# Patient Record
Sex: Male | Born: 1960 | Race: White | Hispanic: No | Smoking: Current every day smoker
Health system: Southern US, Community
[De-identification: ages and names within clinical notes are randomized; demographics above are authoritative.]

## PROBLEM LIST (undated history)

## (undated) DIAGNOSIS — K4090 Unilateral inguinal hernia, without obstruction or gangrene, not specified as recurrent: Secondary | ICD-10-CM

## (undated) DIAGNOSIS — Z972 Presence of dental prosthetic device (complete) (partial): Secondary | ICD-10-CM

## (undated) DIAGNOSIS — N401 Enlarged prostate with lower urinary tract symptoms: Secondary | ICD-10-CM

## (undated) DIAGNOSIS — R351 Nocturia: Secondary | ICD-10-CM

## (undated) DIAGNOSIS — K219 Gastro-esophageal reflux disease without esophagitis: Secondary | ICD-10-CM

## (undated) DIAGNOSIS — Z87828 Personal history of other (healed) physical injury and trauma: Secondary | ICD-10-CM

---

## 1991-03-10 HISTORY — PX: TRANSURETHRAL RESECTION OF PROSTATE: SHX73

## 2004-01-30 ENCOUNTER — Ambulatory Visit: Payer: Self-pay | Admitting: Family Medicine

## 2004-02-26 ENCOUNTER — Ambulatory Visit: Payer: Self-pay | Admitting: Internal Medicine

## 2007-09-19 ENCOUNTER — Encounter: Payer: Self-pay | Admitting: Internal Medicine

## 2016-09-30 ENCOUNTER — Ambulatory Visit: Payer: Self-pay | Admitting: General Surgery

## 2016-09-30 NOTE — H&P (Signed)
Mario KingfisherRichard Glover 09/30/2016 11:33 AM Location: [facility] Patient #: [id number] DOB: June 30, 1960 Single / Language: [place] / Race: White Male  History of Present Illness Mario Glover(Dierdra Salameh J. Cloria Ciresi MD; 09/30/2016 12:21 PM) The patient is a 56 year old male.   Note:He is referred by Dr. [name] for consultation regarding a symptomatic left inguinal hernia. He has a lot of testicular pain on the left side especially when sitting or when having sex. He is been seen by other physicians. Dr. [name] saw him and diagnosed him with a left inguinal hernia. He states his brother has had inguinal hernias in the past. He does have to intermittently strain to start his urinary stream. No constipation. No chronic cough. He is a smoker.  Past [place] History ([name]; 09/30/2016 11:33 AM) TURP  Allergies ([name]; 09/30/2016 11:33 AM) No Known Drug Allergies 09/30/2016  Medication History ([name]; 09/30/2016 11:34 AM) No Current Medications Medications Reconciled  Social History ([name], CMA; 09/30/2016 11:33 AM) Alcohol use Occasional alcohol use. Caffeine use Carbonated beverages. No drug use Tobacco use Current some day smoker.  Family History ([name]; 09/30/2016 11:33 AM) Alcohol Abuse Brother. Heart Disease Father. Heart disease in male family member before age 57  Other Problems ([name], CMA; 09/30/2016 11:33 AM) Inguinal Hernia     Review of Systems ([name] CMA; 09/30/2016 11:33 AM) General Not Present- Appetite Loss, Chills, Fatigue, Fever, Night Sweats, Weight Gain and Weight Loss. Skin Not Present- Change in Wart/Mole, Dryness, Hives, Jaundice, New Lesions, Non-Healing Wounds, Rash and Ulcer. HEENT Present- Wears glasses/contact lenses. Not Present- Earache, Hearing Loss, Hoarseness, Nose Bleed, Oral Ulcers, Ringing in the Ears, Seasonal Allergies, Sinus Pain, Sore Throat, Visual Disturbances and  Yellow Eyes. Respiratory Present- Snoring. Not Present- Bloody sputum, Chronic Cough, Difficulty Breathing and Wheezing. Breast Not Present- Breast Mass, Breast Pain, Nipple Discharge and Skin Changes. Cardiovascular Not Present- Chest Pain, Difficulty Breathing Lying Down, Leg Cramps, Palpitations, Rapid Heart Rate, Shortness of Breath and Swelling of Extremities. Gastrointestinal Not Present- Abdominal Pain, Bloating, Bloody Stool, Change in Bowel Habits, Chronic diarrhea, Constipation, Difficulty Swallowing, Excessive gas, Gets full quickly at meals, Hemorrhoids, Indigestion, Nausea, Rectal Pain and Vomiting. Male Genitourinary Not Present- Blood in Urine, Change in Urinary Stream, Frequency, Impotence, Nocturia, Painful Urination, Urgency and Urine Leakage.  Vitals ([name] CMA; 09/30/2016 11:34 AM) 09/30/2016 11:34 AM Weight: 235.5 lb Height: 71in Body Surface Area: 2.26 m Body Mass Index: 32.85 kg/m  Temp.: 98.58F(Oral)  Pulse: 84 (Regular)  BP: 144/92 (Sitting, Left Arm, Standard)      Physical Exam Mario Glover(Makela Niehoff J. Bobbyjo Marulanda MD; 09/30/2016 12:31 PM)  The physical exam findings are as follows: Note:GENERAL APPEARANCE: Obese male in NAD. Pleasant and cooperative.  EARS, NOSE, MOUTH THROAT: [place]/AT external ears: no lesions or deformities external nose: no lesions or deformities hearing: grossly normal lips: moist, no deformities EYES external: conjunctiva, lids, sclerae normal pupils: equal, round glasses: no   CV ascultation: RRR, no murmur extremity edema: no  RESP/CHEST auscultation: breath sounds equal and clear respiratory effort: normal  GASTROINTESTINAL abdomen: Soft, obese, non-tender, non-distended, no masses liver and spleen: not enlarged. hernia: small umbilical hernia; left inguinal hernia scar: none present   GENITOURINARY scrotum: no masses, mild left testicular tenderness penis: no lesions  MUSCULOSKELETAL station  and gait: normal digits/nails: contractures on hands muscle strength: grossly normal all extremities instability: none  SKIN jaundice: none  NEUROLOGIC speech: normal  PSYCHIATRIC alertness and orientation: normal mood/affect/behavior: normal judgement  and insight: normal    Assessment & Plan Mario Glover(Brendolyn Stockley J. Eyan Hagood MD; 09/30/2016 12:32 PM)  INGUINAL HERNIA OF LEFT SIDE WITHOUT OBSTRUCTION OR GANGRENE (K40.90) Impression: This is symptomatic and he would like something done about it. Also has a small umbilical hernia which is asymptomatic. He does have some symptoms suggesting BPH.  Plan: Preoperative Flomax. Open left inguinal hernia repair with mesh. I have explained the procedure, risks, and aftercare of inguinal hernia repair. Risks include but are not limited to bleeding, infection, wound problems, anesthesia, recurrence, bladder or intestine injury, urinary retention, testicular dysfunction,failure to relieve his current pain, chronic postop pain, mesh problems. He seems to understand and agrees with the plan. I have asked him to stop smoking.  Avel Peaceodd Danyla Wattley, M.D.

## 2016-10-01 ENCOUNTER — Encounter (HOSPITAL_BASED_OUTPATIENT_CLINIC_OR_DEPARTMENT_OTHER): Payer: Self-pay | Admitting: *Deleted

## 2016-10-01 NOTE — Progress Notes (Signed)
NPO AFTER MN.   ARRIVE AT [id number].  NEEDS HG.  WILL TAKE PEPCID AM DOS W/ SIPS OF WATER.  PT VERBALIZED UNDERSTANDING TO DO HIBICLENS SHOWER HS BEFORE AND AM DOS.

## 2016-10-12 ENCOUNTER — Ambulatory Visit (HOSPITAL_BASED_OUTPATIENT_CLINIC_OR_DEPARTMENT_OTHER)
Admission: RE | Admit: 2016-10-12 | Discharge: 2016-10-12 | Disposition: A | Discharge status: Home / Self Care | Payer: Self-pay | Source: Ambulatory Visit | Attending: General Surgery | Admitting: General Surgery

## 2016-10-12 ENCOUNTER — Encounter (HOSPITAL_BASED_OUTPATIENT_CLINIC_OR_DEPARTMENT_OTHER): Payer: Self-pay | Admitting: Anesthesiology

## 2016-10-12 ENCOUNTER — Ambulatory Visit (HOSPITAL_BASED_OUTPATIENT_CLINIC_OR_DEPARTMENT_OTHER): Payer: Self-pay | Admitting: Anesthesiology

## 2016-10-12 ENCOUNTER — Encounter (HOSPITAL_BASED_OUTPATIENT_CLINIC_OR_DEPARTMENT_OTHER)
Admission: RE | Disposition: A | Discharge status: Home / Self Care | Payer: Self-pay | Source: Ambulatory Visit | Attending: General Surgery

## 2016-10-12 DIAGNOSIS — K429 Umbilical hernia without obstruction or gangrene: Secondary | ICD-10-CM | POA: Insufficient documentation

## 2016-10-12 DIAGNOSIS — K4090 Unilateral inguinal hernia, without obstruction or gangrene, not specified as recurrent: Secondary | ICD-10-CM | POA: Insufficient documentation

## 2016-10-12 DIAGNOSIS — F17200 Nicotine dependence, unspecified, uncomplicated: Secondary | ICD-10-CM | POA: Insufficient documentation

## 2016-10-12 HISTORY — DX: Gastro-esophageal reflux disease without esophagitis: K21.9

## 2016-10-12 HISTORY — DX: Presence of dental prosthetic device (complete) (partial): Z97.2

## 2016-10-12 HISTORY — DX: Unilateral inguinal hernia, without obstruction or gangrene, not specified as recurrent: K40.90

## 2016-10-12 HISTORY — PX: INGUINAL HERNIA REPAIR: SHX194

## 2016-10-12 HISTORY — DX: Nocturia: R35.1

## 2016-10-12 HISTORY — DX: Benign prostatic hyperplasia with lower urinary tract symptoms: N40.1

## 2016-10-12 HISTORY — PX: INSERTION OF MESH: SHX5868

## 2016-10-12 HISTORY — DX: Personal history of other (healed) physical injury and trauma: Z87.828

## 2016-10-12 LAB — POCT HEMOGLOBIN-HEMACUE: HEMOGLOBIN: 17.6 g/dL — AB (ref 13.0–17.0)

## 2016-10-12 SURGERY — REPAIR, HERNIA, INGUINAL, ADULT
Anesthesia: General | Site: Abdomen | Laterality: Left

## 2016-10-12 MED ORDER — FENTANYL CITRATE (PF) 100 MCG/2ML IJ SOLN
INTRAMUSCULAR | Status: AC
  Filled 2016-10-12: qty 2

## 2016-10-12 MED ORDER — LIDOCAINE 2% (20 MG/ML) 5 ML SYRINGE
INTRAMUSCULAR | Status: DC | PRN
  Administered 2016-10-12: 100 mg via INTRAVENOUS

## 2016-10-12 MED ORDER — ACETAMINOPHEN 325 MG PO TABS
650.0000 mg | ORAL_TABLET | ORAL | Status: DC | PRN
  Filled 2016-10-12: qty 2

## 2016-10-12 MED ORDER — MIDAZOLAM HCL 5 MG/5ML IJ SOLN
INTRAMUSCULAR | Status: DC | PRN
  Administered 2016-10-12: 2 mg via INTRAVENOUS

## 2016-10-12 MED ORDER — LACTATED RINGERS IV SOLN
INTRAVENOUS | Status: DC
  Administered 2016-10-12 (×2): via INTRAVENOUS
  Filled 2016-10-12: qty 1000

## 2016-10-12 MED ORDER — CEFAZOLIN SODIUM-DEXTROSE 2-4 GM/100ML-% IV SOLN
INTRAVENOUS | Status: AC
Start: 2016-10-12 — End: 2016-10-12
  Filled 2016-10-12: qty 100

## 2016-10-12 MED ORDER — MIDAZOLAM HCL 2 MG/2ML IJ SOLN
1.0000 mg | Freq: Once | INTRAMUSCULAR | Status: AC
  Administered 2016-10-12: 1 mg via INTRAVENOUS
  Filled 2016-10-12: qty 1

## 2016-10-12 MED ORDER — ONDANSETRON HCL 4 MG/2ML IJ SOLN
INTRAMUSCULAR | Status: AC
  Filled 2016-10-12: qty 2

## 2016-10-12 MED ORDER — DEXAMETHASONE SODIUM PHOSPHATE 10 MG/ML IJ SOLN
INTRAMUSCULAR | Status: AC
  Filled 2016-10-12: qty 1

## 2016-10-12 MED ORDER — PROPOFOL 10 MG/ML IV BOLUS
INTRAVENOUS | Status: AC
  Filled 2016-10-12: qty 20

## 2016-10-12 MED ORDER — OXYCODONE HCL 5 MG PO TABS
5.0000 mg | ORAL_TABLET | ORAL | Status: DC | PRN
  Administered 2016-10-12: 5 mg via ORAL
  Filled 2016-10-12: qty 2

## 2016-10-12 MED ORDER — DEXAMETHASONE SODIUM PHOSPHATE 4 MG/ML IJ SOLN
INTRAMUSCULAR | Status: DC | PRN
  Administered 2016-10-12: 10 mg via INTRAVENOUS

## 2016-10-12 MED ORDER — CHLORHEXIDINE GLUCONATE CLOTH 2 % EX PADS
6.0000 | MEDICATED_PAD | Freq: Once | CUTANEOUS | Status: DC
  Filled 2016-10-12: qty 6

## 2016-10-12 MED ORDER — FENTANYL CITRATE (PF) 100 MCG/2ML IJ SOLN
INTRAMUSCULAR | Status: DC | PRN
  Administered 2016-10-12 (×8): 25 ug via INTRAVENOUS

## 2016-10-12 MED ORDER — ACETAMINOPHEN 650 MG RE SUPP
650.0000 mg | RECTAL | Status: DC | PRN
  Filled 2016-10-12: qty 1

## 2016-10-12 MED ORDER — MIDAZOLAM HCL 2 MG/2ML IJ SOLN
INTRAMUSCULAR | Status: AC
  Filled 2016-10-12: qty 2

## 2016-10-12 MED ORDER — KETOROLAC TROMETHAMINE 30 MG/ML IJ SOLN
INTRAMUSCULAR | Status: DC | PRN
  Administered 2016-10-12: 30 mg via INTRAVENOUS

## 2016-10-12 MED ORDER — CEFAZOLIN SODIUM-DEXTROSE 2-4 GM/100ML-% IV SOLN
2.0000 g | INTRAVENOUS | Status: AC
  Administered 2016-10-12: 2 g via INTRAVENOUS
  Filled 2016-10-12: qty 100

## 2016-10-12 MED ORDER — SODIUM CHLORIDE 0.9% FLUSH
3.0000 mL | INTRAVENOUS | Status: DC | PRN
  Filled 2016-10-12: qty 3

## 2016-10-12 MED ORDER — LIDOCAINE 2% (20 MG/ML) 5 ML SYRINGE
INTRAMUSCULAR | Status: AC
  Filled 2016-10-12: qty 5

## 2016-10-12 MED ORDER — HYDROMORPHONE HCL 1 MG/ML IJ SOLN
0.2500 mg | INTRAMUSCULAR | Status: DC | PRN
  Filled 2016-10-12: qty 0.5

## 2016-10-12 MED ORDER — ONDANSETRON HCL 4 MG/2ML IJ SOLN
INTRAMUSCULAR | Status: DC | PRN
  Administered 2016-10-12: 4 mg via INTRAVENOUS

## 2016-10-12 MED ORDER — BUPIVACAINE HCL (PF) 0.5 % IJ SOLN
INTRAMUSCULAR | Status: DC | PRN
  Administered 2016-10-12: 21 mL

## 2016-10-12 MED ORDER — FENTANYL CITRATE (PF) 100 MCG/2ML IJ SOLN
100.0000 ug | Freq: Once | INTRAMUSCULAR | Status: AC
Start: 2016-10-12 — End: 2016-10-12
  Administered 2016-10-12: 100 ug via INTRAVENOUS
  Filled 2016-10-12: qty 2

## 2016-10-12 MED ORDER — PROMETHAZINE HCL 25 MG/ML IJ SOLN
6.2500 mg | INTRAMUSCULAR | Status: DC | PRN
  Filled 2016-10-12: qty 1

## 2016-10-12 MED ORDER — PROPOFOL 10 MG/ML IV BOLUS
INTRAVENOUS | Status: DC | PRN
  Administered 2016-10-12 (×2): 100 mg via INTRAVENOUS
  Administered 2016-10-12: 200 mg via INTRAVENOUS

## 2016-10-12 MED ORDER — OXYCODONE HCL 5 MG PO TABS: 5.0000 mg | ORAL_TABLET | ORAL | 0 refills | Status: AC | PRN

## 2016-10-12 MED ORDER — KETOROLAC TROMETHAMINE 30 MG/ML IJ SOLN
INTRAMUSCULAR | Status: AC
  Filled 2016-10-12: qty 1

## 2016-10-12 MED ORDER — OXYCODONE HCL 5 MG PO TABS
ORAL_TABLET | ORAL | Status: AC
  Filled 2016-10-12: qty 1

## 2016-10-12 MED ORDER — BUPIVACAINE-EPINEPHRINE (PF) 0.5% -1:200000 IJ SOLN
INTRAMUSCULAR | Status: DC | PRN
  Administered 2016-10-12: 25 mL

## 2016-10-12 SURGICAL SUPPLY — 54 items
APL SKNCLS STERI-STRIP NONHPOA (GAUZE/BANDAGES/DRESSINGS) ×1
BENZOIN TINCTURE PRP APPL 2/3 (GAUZE/BANDAGES/DRESSINGS) ×4 IMPLANT
BLADE CLIPPER SURG (BLADE) ×4 IMPLANT
BLADE HEX COATED 2.75 (ELECTRODE) ×4 IMPLANT
BLADE SURG 10 STRL SS (BLADE) ×4 IMPLANT
BLADE SURG 15 STRL LF DISP TIS (BLADE) ×2 IMPLANT
BLADE SURG 15 STRL SS (BLADE) ×4
CANISTER SUCTION 1200CC (MISCELLANEOUS) ×4 IMPLANT
CHLORAPREP W/TINT 26ML (MISCELLANEOUS) ×4 IMPLANT
CLOSURE WOUND 1/2 X4 (GAUZE/BANDAGES/DRESSINGS) ×1
CLOTH BEACON ORANGE TIMEOUT ST (SAFETY) ×4 IMPLANT
COVER BACK TABLE 60X90IN (DRAPES) ×4 IMPLANT
COVER MAYO STAND STRL (DRAPES) ×4 IMPLANT
DISSECTOR ROUND CHERRY 3/8 STR (MISCELLANEOUS) IMPLANT
DRAIN PENROSE 18X1/2 LTX STRL (DRAIN) ×4 IMPLANT
DRAPE INCISE IOBAN 66X45 STRL (DRAPES) ×4 IMPLANT
DRAPE LAPAROTOMY TRNSV 102X78 (DRAPE) ×4 IMPLANT
DRAPE UTILITY XL STRL (DRAPES) ×4 IMPLANT
DRSG TEGADERM 2-3/8X2-3/4 SM (GAUZE/BANDAGES/DRESSINGS) IMPLANT
DRSG TEGADERM 4X4.75 (GAUZE/BANDAGES/DRESSINGS) ×4 IMPLANT
DRSG TELFA 3X8 NADH (GAUZE/BANDAGES/DRESSINGS) ×4 IMPLANT
ELECT REM PT RETURN 9FT ADLT (ELECTROSURGICAL) ×4
ELECTRODE REM PT RTRN 9FT ADLT (ELECTROSURGICAL) ×2 IMPLANT
GAUZE SPONGE 4X4 12PLY STRL (GAUZE/BANDAGES/DRESSINGS) ×4 IMPLANT
GAUZE SPONGE 4X4 16PLY XRAY LF (GAUZE/BANDAGES/DRESSINGS) IMPLANT
GLOVE ECLIPSE 8.0 STRL XLNG CF (GLOVE) ×4 IMPLANT
GLOVE INDICATOR 8.0 STRL GRN (GLOVE) ×4 IMPLANT
GOWN STRL REUS W/ TWL LRG LVL3 (GOWN DISPOSABLE) ×2 IMPLANT
GOWN STRL REUS W/ TWL XL LVL3 (GOWN DISPOSABLE) ×2 IMPLANT
GOWN STRL REUS W/TWL LRG LVL3 (GOWN DISPOSABLE) ×4
GOWN STRL REUS W/TWL XL LVL3 (GOWN DISPOSABLE) ×3
KIT RM TURNOVER CYSTO AR (KITS) ×4 IMPLANT
MESH HERNIA 6X6 BARD (Mesh General) ×2 IMPLANT
MESH HERNIA BARD 6X6 (Mesh General) ×2 IMPLANT
NEEDLE HYPO 25X1 1.5 SAFETY (NEEDLE) ×4 IMPLANT
NS IRRIG 500ML POUR BTL (IV SOLUTION) ×4 IMPLANT
PACK BASIN DAY SURGERY FS (CUSTOM PROCEDURE TRAY) ×4 IMPLANT
PENCIL BUTTON HOLSTER BLD 10FT (ELECTRODE) ×4 IMPLANT
SPONGE LAP 4X18 X RAY DECT (DISPOSABLE) ×4 IMPLANT
STRIP CLOSURE SKIN 1/2X4 (GAUZE/BANDAGES/DRESSINGS) ×3 IMPLANT
SUT MNCRL AB 4-0 PS2 18 (SUTURE) ×8 IMPLANT
SUT NOVA 0 T19/GS 22DT (SUTURE) IMPLANT
SUT PROLENE 0 CT 2 (SUTURE) ×4 IMPLANT
SUT PROLENE 2 0 CT2 30 (SUTURE) ×8 IMPLANT
SUT SURG 0 T 19/GS 22 1969 62 (SUTURE) IMPLANT
SUT VIC AB 2-0 SH 18 (SUTURE) ×8 IMPLANT
SUT VIC AB 3-0 SH 27 (SUTURE) ×4
SUT VIC AB 3-0 SH 27X BRD (SUTURE) ×2 IMPLANT
SYR BULB IRRIGATION 50ML (SYRINGE) ×4 IMPLANT
SYR CONTROL 10ML LL (SYRINGE) ×4 IMPLANT
TOWEL OR 17X24 6PK STRL BLUE (TOWEL DISPOSABLE) ×8 IMPLANT
TUBE CONNECTING 12'X1/4 (SUCTIONS)
TUBE CONNECTING 12X1/4 (SUCTIONS) IMPLANT
YANKAUER SUCT BULB TIP NO VENT (SUCTIONS) ×4 IMPLANT

## 2016-10-12 NOTE — Anesthesia Procedure Notes (Signed)
Procedure Name: LMA Insertion Date/Time: 10/12/2016 9:46 AM Performed by: Jessica PriestBEESON, Jalacia Mattila C Pre-anesthesia Checklist: Patient identified, Emergency Drugs available, Suction available and Patient being monitored Patient Re-evaluated:Patient Re-evaluated prior to induction Oxygen Delivery Method: Circle system utilized Preoxygenation: Pre-oxygenation with 100% oxygen Induction Type: IV induction Ventilation: Mask ventilation without difficulty LMA: LMA inserted LMA Size: 5.0 Number of attempts: 1 Airway Equipment and Method: Bite block Placement Confirmation: positive ETCO2 and breath sounds checked- equal and bilateral Tube secured with: Tape Dental Injury: Teeth and Oropharynx as per pre-operative assessment

## 2016-10-12 NOTE — Anesthesia Postprocedure Evaluation (Signed)
Anesthesia Post Note  Patient: Mario Glover  Procedure(s) Performed: Procedure(s) (LRB): LEFT INGUINAL HERNIA REPAIR (Left) INSERTION OF MESH (Left)     Patient location during evaluation: PACU Anesthesia Type: General and Regional Level of consciousness: awake and alert Pain management: pain level controlled Vital Signs Assessment: post-procedure vital signs reviewed and stable Respiratory status: spontaneous breathing, nonlabored ventilation, respiratory function stable and patient connected to nasal cannula oxygen Cardiovascular status: blood pressure returned to baseline and stable Postop Assessment: no signs of nausea or vomiting Anesthetic complications: no    Last Vitals:  Vitals:   10/12/16 1130 10/12/16 1247  BP: 137/86 (!) 139/92  Pulse: 76 73  Resp: 10 16  Temp:  36.7 C    Last Pain:  Vitals:   10/12/16 1215  TempSrc:   PainSc: 4                  Everette Dimauro,JAMES TERRILL

## 2016-10-12 NOTE — H&P (View-Only) (Signed)
Mario KingfisherRichard Glover 09/30/2016 11:33 AM Location: [facility] Patient #: [id number] DOB: June 30, 1960 Single / Language: [place] / Race: White Male  History of Present Illness Mario Glover([name] [name] MD; 09/30/2016 12:21 PM) The patient is a 56 year old male.   Note:He is referred by Dr. [name] for consultation regarding a symptomatic left inguinal hernia. He has a lot of testicular pain on the left side especially when sitting or when having sex. He is been seen by other physicians. Dr. [name] saw him and diagnosed him with a left inguinal hernia. He states his brother has had inguinal hernias in the past. He does have to intermittently strain to start his urinary stream. No constipation. No chronic cough. He is a smoker.  Past Surgical [place] ([name]; 09/30/2016 11:33 AM) TURP  Allergies ([name]; 09/30/2016 11:33 AM) No Known Drug Allergies 09/30/2016  Medication History ([name]; 09/30/2016 11:34 AM) No Current Medications Medications Reconciled  Social History ([name], CMA; 09/30/2016 11:33 AM) Alcohol use Occasional alcohol use. Caffeine use Carbonated beverages. No drug use Tobacco use Current some day smoker.  Family History ([name]; 09/30/2016 11:33 AM) Alcohol Abuse Brother. Heart Disease Father. Heart disease in male family member before age 57  Other Problems ([name], CMA; 09/30/2016 11:33 AM) Inguinal Hernia     Review of Systems ([name] CMA; 09/30/2016 11:33 AM) General Not Present- Appetite Loss, Chills, Fatigue, Fever, Night Sweats, Weight Gain and Weight Loss. Skin Not Present- Change in Wart/Mole, Dryness, Hives, Jaundice, New Lesions, Non-Healing Wounds, Rash and Ulcer. HEENT Present- Wears glasses/contact lenses. Not Present- Earache, Hearing Loss, Hoarseness, Nose Bleed, Oral Ulcers, Ringing in the Ears, Seasonal Allergies, Sinus Pain, Sore Throat, Visual Disturbances and  Yellow Eyes. Respiratory Present- Snoring. Not Present- Bloody sputum, Chronic Cough, Difficulty Breathing and Wheezing. Breast Not Present- Breast Mass, Breast Pain, Nipple Discharge and Skin Changes. Cardiovascular Not Present- Chest Pain, Difficulty Breathing Lying Down, Leg Cramps, Palpitations, Rapid Heart Rate, Shortness of Breath and Swelling of Extremities. Gastrointestinal Not Present- Abdominal Pain, Bloating, Bloody Stool, Change in Bowel Habits, Chronic diarrhea, Constipation, Difficulty Swallowing, Excessive gas, Gets full quickly at meals, Hemorrhoids, Indigestion, Nausea, Rectal Pain and Vomiting. Male Genitourinary Not Present- Blood in Urine, Change in Urinary Stream, Frequency, Impotence, Nocturia, Painful Urination, Urgency and Urine Leakage.  Vitals ([name] CMA; 09/30/2016 11:34 AM) 09/30/2016 11:34 AM Weight: 235.5 lb Height: 71in Body Surface Area: 2.26 m Body Mass Index: 32.85 kg/m  Temp.: 98.58F(Oral)  Pulse: 84 (Regular)  BP: 144/92 (Sitting, Left Arm, Standard)      Physical Exam Mario Glover([name] [name] MD; 09/30/2016 12:31 PM)  The physical exam findings are as follows: Note:GENERAL APPEARANCE: Obese male in NAD. Pleasant and cooperative.  EARS, NOSE, MOUTH THROAT: [place]/AT external ears: no lesions or deformities external nose: no lesions or deformities hearing: grossly normal lips: moist, no deformities EYES external: conjunctiva, lids, sclerae normal pupils: equal, round glasses: no   CV ascultation: RRR, no murmur extremity edema: no  RESP/CHEST auscultation: breath sounds equal and clear respiratory effort: normal  GASTROINTESTINAL abdomen: Soft, obese, non-tender, non-distended, no masses liver and spleen: not enlarged. hernia: small umbilical hernia; left inguinal hernia scar: none present   GENITOURINARY scrotum: no masses, mild left testicular tenderness penis: no lesions  MUSCULOSKELETAL station  and gait: normal digits/nails: contractures on hands muscle strength: grossly normal all extremities instability: none  SKIN jaundice: none  NEUROLOGIC speech: normal  PSYCHIATRIC alertness and orientation: normal mood/affect/behavior: normal judgement  and insight: normal    Assessment & Plan Mario Glover([name] [name] MD; 09/30/2016 12:32 PM)  INGUINAL HERNIA OF LEFT SIDE WITHOUT OBSTRUCTION OR GANGRENE (K40.90) Impression: This is symptomatic and he would like something done about it. Also has a small umbilical hernia which is asymptomatic. He does have some symptoms suggesting BPH.  Plan: Preoperative Flomax. Open left inguinal hernia repair with mesh. I have explained the procedure, risks, and aftercare of inguinal hernia repair. Risks include but are not limited to bleeding, infection, wound problems, anesthesia, recurrence, bladder or intestine injury, urinary retention, testicular dysfunction,failure to relieve his current pain, chronic postop pain, mesh problems. He seems to understand and agrees with the plan. I have asked him to stop smoking.  [name], M.D.

## 2016-10-12 NOTE — Op Note (Signed)
OPERATIVE NOTE-INGUINAL HERNIA REPAIR  Preoperative diagnosis:  Left inguinal hernia.  Postoperative diagnosis:  Same (Pantaloon hernia)  Procedure:  Right/Left inguinal hernia repair with mesh.  Surgeon:  Avel Peaceodd Shawndell Schillaci, M.D.  Anesthesia:  General/LMA, TAP block, and local (Marcaine).  Indication:  This is a 56 year old male who has a symptomatic left inguinal hernia. He now presents for repair.  EBL < 100 ml  Technique:  He was seen in the holding room and the left groin was marked with my initials. He was brought to the operating, placed supine on the operating table, and the anesthetic was administered by the anesthesiologist. The hair in the groin area was clipped as was felt to be necessary. This area was then sterilely prepped and draped. A timeout was performed.  Local anesthetic was infiltrated in the superficial and deep tissues in the left groin.  An incision was made through the skin and subcutaneous tissue until the external oblique aponeurosis was identified.  Local anesthetic was infiltrated deep to the external oblique aponeurosis. The external oblique aponeurosis was divided through the external ring medially and back toward the anterior superior iliac spine laterally. Using blunt dissection, the shelving edge of the inguinal ligament was identified inferiorly and the internal oblique aponeurosis and muscle were identified superiorly. The ilioinguinal nerve was identified.  The spermatic cord was isolated and a posterior window was made around it. Direct and indirect inguinal hernias were noted (pantaloon hernia). These were separated from the spermatic cord and reduced.   A piece of 6" x 6" polypropylene mesh was brought into the field, cut to 4" x 6" and anchored 1-2 cm medial to the pubic tubercle with 2-0 Prolene suture. The inferior aspect of the mesh was anchored to the shelving edge of the inguinal ligament with running 2-0 Prolene suture to a level 1-2 cm lateral to  the internal ring. A slit was cut in the mesh creating 2 tails. These were wrapped around the spermatic cord. The superior aspect of the mesh was anchored to the internal oblique aponeurosis and muscle with interrupted 2-0 Vicryl sutures. The 2 tails of the mesh were then crossed creating a new internal ring and were anchored to the shelving edge of the inguinal ligament with 2-0 Prolene suture. The tip of a hemostat could be placed through the new aperture. The lateral aspect of the mesh was then tucked deep to the external oblique aponeurosis. This provided for adequate coverage with good overlap of the hernia defects.  The wound was inspected and hemostasis was adequate. The external oblique aponeurosis was then closed over the mesh and cord with running 3-0 Vicryl suture. The subcutaneous tissue was closed with running 3-0 Vicryl suture. The skin closed with a running 4-0 Monocryl subcuticular stitch.  Steri-Strips and a sterile dressing were applied.  The procedure was well-tolerated without any apparent complications and he was taken to the recovery room in satisfactory condition.

## 2016-10-12 NOTE — Transfer of Care (Signed)
Immediate Anesthesia Transfer of Care Note  Patient: Mario Glover  Procedure(s) Performed: Procedure(s) (LRB): LEFT INGUINAL HERNIA REPAIR (Left) INSERTION OF MESH (Left)  Patient Location: PACU  Anesthesia Type: General  Level of Consciousness: awake, sedated, patient cooperative and responds to stimulation  Airway & Oxygen Therapy: Patient Spontanous Breathing and Patient connected to [place] O2  Post-op Assessment: Report given to PACU RN, Post -op Vital signs reviewed and stable and Patient moving all extremities  Post vital signs: Reviewed and stable  Complications: No apparent anesthesia complications

## 2016-10-12 NOTE — Discharge Instructions (Addendum)
[facility] _______[facility] ° ° INGUINAL HERNIA REPAIR: POST OP INSTRUCTIONS ° °Always review your discharge instruction sheet given to you by the facility where your surgery was performed. °IF YOU HAVE DISABILITY OR FAMILY LEAVE FORMS, YOU MUST BRING THEM TO THE OFFICE FOR PROCESSING.   °DO NOT GIVE THEM TO YOUR DOCTOR. ° °1. A  prescription for pain medication may be given to you upon discharge.  Take your pain medication as prescribed, if needed.  If narcotic pain medicine is not needed, then you may take acetaminophen (Tylenol) or ibuprofen (Advil) as needed. °2. Take your usually prescribed medications unless otherwise directed. °3. If you need a refill on your pain medication, please contact your pharmacy.  They will contact our office to request authorization. Prescriptions will not be filled after 5 pm or on week-ends. °4. You should follow a light diet the first 24 hours after arrival home, such as soup and crackers, etc.  Be sure to include lots of fluids daily.  Resume your normal diet the day after surgery. °5. Most patients will experience some swelling and bruising in the groin area (and scrotum in men).  Ice packs and reclining will help.  Swelling and bruising can take many days to resolve.  °6. It is common to experience some constipation if taking pain medication after surgery.  Increasing fluid intake and taking a stool softener (such as Colace) will usually help or prevent this problem from occurring.  A mild laxative (Milk of Magnesia or Miralax) should be taken according to package directions if there are no bowel movements after 48 hours. °7. Unless discharge instructions indicate otherwise, you may remove your bandages 4 days after surgery, and you may shower at that time.  You may have steri-strips (small skin tapes) in place directly over the incision.  These strips should be left on the skin.  If your surgeon used skin glue on the incision, you may shower in 24 hours.  The glue  will flake off over the next 2-3 weeks.  Any sutures or staples will be removed at the office during your follow-up visit. °8. ACTIVITIES:  You may resume regular (light) daily activities beginning the next day--such as daily self-care, walking, climbing stairs--gradually increasing activities as tolerated.  You may have sexual intercourse when it is comfortable.  Refrain from any heavy lifting or straining-nothing over 10 pounds for 6 weeks.  Do note lie flat for 2-3 days. °a. You may drive when you are no longer taking prescription pain medication, you can comfortably wear a seatbelt, and you can safely maneuver your car and apply brakes. °b. RETURN TO WORK:  Desk work/Light work in 1-2 weeks, full duty in 6 weeks._________________________________________________________ °9. You should see your doctor in the office for a follow-up appointment approximately 2-3 weeks after your surgery.  Make sure that you call for this appointment within a day or two after you arrive home to insure a convenient appointment time. °10. OTHER INSTRUCTIONS:  __________________________________________________________________________________________________________________________________________________________________________________________  °WHEN TO CALL YOUR DOCTOR: °1. Fever over 101.0 °2. Inability to urinate °3. Nausea and/or vomiting °4. Extreme swelling or bruising °5. Continued bleeding from incision. °6. Increased pain, redness, or drainage from the incision ° °The clinic staff is available to answer your questions during regular business hours.  Please don’t hesitate to call and ask to speak to one of the nurses for clinical concerns.  If you have a medical emergency, go to the nearest emergency room or call 911.  A   surgeon from [facility] is always on call at the hospital   [address], [address], [place]  [address] ?  [address] [address], [place]   [address] [phone] ? [phone]  ? FAX [phone] Web site: [email or web address]   Post Anesthesia Home Care Instructions  Activity: Get plenty of rest for the remainder of the day. A responsible individual must stay with you for 24 hours following the procedure.  For the next 24 hours, DO NOT: -Drive a car -Advertising copywriterperate machinery -Drink alcoholic beverages -Take any medication unless instructed by your physician -Make any legal decisions or sign important papers.  Meals: Start with liquid foods such as gelatin or soup. Progress to regular foods as tolerated. Avoid greasy, spicy, heavy foods. If nausea and/or vomiting occur, drink only clear liquids until the nausea and/or vomiting subsides. Call your physician if vomiting continues.  Special Instructions/Symptoms: Your throat may feel dry or sore from the anesthesia or the breathing tube placed in your throat during surgery. If this causes discomfort, gargle with warm salt water. The discomfort should disappear within 24 hours.  If you had a scopolamine patch placed behind your ear for the management of post- operative nausea and/or vomiting:  1. The medication in the patch is effective for 72 hours, after which it should be removed.  Wrap patch in a tissue and discard in the trash. Wash hands thoroughly with soap and water. 2. You may remove the patch earlier than 72 hours if you experience unpleasant side effects which may include dry mouth, dizziness or visual disturbances. 3. Avoid touching the patch. Wash your hands with soap and water after contact with the patch.

## 2016-10-12 NOTE — Anesthesia Preprocedure Evaluation (Addendum)
Anesthesia Evaluation  Patient identified by MRN, date of birth, ID band Patient awake    Reviewed: Allergy & Precautions, NPO status , Patient's Chart, lab work & pertinent test results  History of Anesthesia Complications Negative for: history of anesthetic complications  Airway Mallampati: II  TM Distance: >3 FB Neck ROM: Full    Dental  (+) Partial Lower, Partial Upper, Caps,    Pulmonary Current Smoker,    breath sounds clear to auscultation       Cardiovascular negative cardio ROS   Rhythm:Regular     Neuro/Psych    GI/Hepatic Neg liver ROS, GERD  ,  Endo/Other  negative endocrine ROS  Renal/GU negative Renal ROS     Musculoskeletal   Abdominal   Peds  Hematology negative hematology ROS (+)   Anesthesia Other Findings   Reproductive/Obstetrics                            Anesthesia Physical Anesthesia Plan  ASA: I  Anesthesia Plan: General   Post-op Pain Management:  Regional for Post-op pain   Induction: Intravenous  PONV Risk Score and Plan: 2 and Ondansetron and Dexamethasone  Airway Management Planned: LMA  Additional Equipment:   Intra-op Plan:   Post-operative Plan: Extubation in OR  Informed Consent: I have reviewed the patients History and Physical, chart, labs and discussed the procedure including the risks, benefits and alternatives for the proposed anesthesia with the patient or authorized representative who has indicated his/her understanding and acceptance.     Plan Discussed with: CRNA  Anesthesia Plan Comments:         Anesthesia Quick Evaluation

## 2016-10-12 NOTE — Interval H&P Note (Signed)
History and Physical Interval Note:  10/12/2016 9:18 AM  Mario Glover  has presented today for surgery, with the diagnosis of left inguinal hernia  The various methods of treatment have been discussed with the patient and family. After consideration of risks, benefits and other options for treatment, the patient has consented to  Procedure(s) with comments: LEFT INGUINAL HERNIA REPAIR (Left) - LMA AND TAP BLOCK INSERTION OF MESH (Left) as a surgical intervention .  The patient's history has been reviewed, patient examined, no change in status, stable for surgery.  I have reviewed the patient's chart and labs.  Questions were answered to the patient's satisfaction.     [name] JShela Commons

## 2016-10-12 NOTE — Anesthesia Procedure Notes (Signed)
Anesthesia Regional Block: TAP block   Pre-Anesthetic Checklist: ,, timeout performed,, Correct Site, Correct Laterality, Correct Procedure, Correct Position, site marked, risks and benefits discussed, Surgical consent,  Pre-op evaluation,  At surgeon's request and post-op pain management  Laterality: Left and Lower  Prep: chloraprep       Needles:   Needle Type: Echogenic Stimulator Needle     Needle Length: 9cm  Needle Gauge: 21   Needle insertion depth: 6 cm   Additional Needles:   Procedures: ultrasound guided,,,,,,,,  Narrative:  Start time: 10/12/2016 9:34 AM End time: 10/12/2016 9:34 AM Injection made incrementally with aspirations every 5 mL.  Performed by: Personally  Anesthesiologist: Kaytlyn Din

## 2016-10-12 NOTE — OR Nursing (Signed)
Multiple old and reddend skin lesions noted on patients upper thighs, MD aware

## 2016-10-13 ENCOUNTER — Encounter (HOSPITAL_BASED_OUTPATIENT_CLINIC_OR_DEPARTMENT_OTHER): Payer: Self-pay | Admitting: General Surgery

## 2017-02-10 ENCOUNTER — Emergency Department: Payer: Self-pay

## 2017-02-10 ENCOUNTER — Other Ambulatory Visit: Payer: Self-pay

## 2017-02-10 ENCOUNTER — Emergency Department
Admission: EM | Admit: 2017-02-10 | Discharge: 2017-02-10 | Disposition: A | Discharge status: Home / Self Care | Payer: Self-pay | Attending: Emergency Medicine | Admitting: Emergency Medicine

## 2017-02-10 DIAGNOSIS — Z79899 Other long term (current) drug therapy: Secondary | ICD-10-CM | POA: Insufficient documentation

## 2017-02-10 DIAGNOSIS — F17210 Nicotine dependence, cigarettes, uncomplicated: Secondary | ICD-10-CM | POA: Insufficient documentation

## 2017-02-10 DIAGNOSIS — E871 Hypo-osmolality and hyponatremia: Secondary | ICD-10-CM | POA: Insufficient documentation

## 2017-02-10 DIAGNOSIS — R079 Chest pain, unspecified: Secondary | ICD-10-CM | POA: Insufficient documentation

## 2017-02-10 LAB — BASIC METABOLIC PANEL
Anion gap: 10 (ref 5–15)
BUN: 9 mg/dL (ref 6–20)
CALCIUM: 9.4 mg/dL (ref 8.9–10.3)
CO2: 25 mmol/L (ref 22–32)
CREATININE: 0.99 mg/dL (ref 0.61–1.24)
Chloride: 96 mmol/L — ABNORMAL LOW (ref 101–111)
GFR calc Af Amer: >60 mL/min (ref >60)
GFR calc non Af Amer: >60 mL/min (ref >60)
GLUCOSE: 126 mg/dL — AB (ref 65–99)
Potassium: 3.5 mmol/L (ref 3.5–5.1)
Sodium: 131 mmol/L — ABNORMAL LOW (ref 135–145)

## 2017-02-10 LAB — CBC
HCT: 49.7 % (ref 40.0–52.0)
Hemoglobin: 16.7 g/dL (ref 13.0–18.0)
MCH: 32 pg (ref 26.0–34.0)
MCHC: 33.6 g/dL (ref 32.0–36.0)
MCV: 95.2 fL (ref 80.0–100.0)
PLATELETS: 234 10*3/uL (ref 150–440)
RBC: 5.23 MIL/uL (ref 4.40–5.90)
RDW: 13.5 % (ref 11.5–14.5)
WBC: 8.2 10*3/uL (ref 3.8–10.6)

## 2017-02-10 LAB — TROPONIN I: Troponin I: <0.03 ng/mL (ref <0.03)

## 2017-02-10 MED ORDER — SODIUM CHLORIDE 0.9 % IV BOLUS (SEPSIS)
1000.0000 mL | Freq: Once | INTRAVENOUS | Status: AC
  Administered 2017-02-10: 1000 mL via INTRAVENOUS

## 2017-02-10 NOTE — Discharge Instructions (Signed)
Please seek medical attention for any high fevers, chest pain, shortness of breath, change in behavior, persistent vomiting, bloody stool or any other new or concerning symptoms.  
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
## 2017-02-10 NOTE — ED Notes (Signed)
NAD noted at time of D/C. Pt denies questions or concerns. Pt ambulatory to the lobby at this time.  

## 2017-02-10 NOTE — ED Notes (Signed)
Unable to obtain E-sig, E-sig pad not working.  

## 2017-02-10 NOTE — ED Triage Notes (Signed)
Pt to ER via POV c/o chest pain to left side and "tingling all over" that started at 1120AM today. Felt fine upon awakening. Pt able to walk, equal strength in all extremities. Facial symmetry, speaks in full and complete clear sentences. Pt alert and oriented X4, active, cooperative, pt in NAD. RR even and unlabored, color WNL.

## 2017-03-30 ENCOUNTER — Ambulatory Visit: Payer: Self-pay | Admitting: Cardiovascular Disease

## 2017-08-12 ENCOUNTER — Other Ambulatory Visit
Admission: RE | Admit: 2017-08-12 | Discharge: 2017-08-12 | Disposition: A | Discharge status: Home / Self Care | Payer: Self-pay | Source: Ambulatory Visit | Attending: Orthopedic Surgery | Admitting: Orthopedic Surgery

## 2017-08-12 DIAGNOSIS — M1712 Unilateral primary osteoarthritis, left knee: Secondary | ICD-10-CM | POA: Insufficient documentation

## 2017-08-12 LAB — SYNOVIAL CELL COUNT + DIFF, W/ CRYSTALS
CRYSTALS FLUID: NONE SEEN
Eosinophils-Synovial: 0 %
Lymphocytes-Synovial Fld: 22 %
Monocyte-Macrophage-Synovial Fluid: 48 %
Neutrophil, Synovial: 30 %
WBC, SYNOVIAL: 289 /mm3 — AB (ref 0–200)

## 2018-07-07 ENCOUNTER — Other Ambulatory Visit: Payer: Self-pay

## 2018-07-07 ENCOUNTER — Ambulatory Visit
Admission: RE | Admit: 2018-07-07 | Discharge: 2018-07-07 | Disposition: A | Discharge status: Home / Self Care | Payer: BLUE CROSS/BLUE SHIELD | Source: Ambulatory Visit | Attending: Orthopedic Surgery | Admitting: Orthopedic Surgery

## 2018-07-07 ENCOUNTER — Other Ambulatory Visit: Payer: Self-pay | Admitting: Orthopedic Surgery

## 2018-07-07 DIAGNOSIS — R2241 Localized swelling, mass and lump, right lower limb: Secondary | ICD-10-CM

## 2018-08-27 IMAGING — CR DG CHEST 2V
1 series · 2 of 2 positions shown · non-contrast
Comparison: None.

CLINICAL DATA: Chest pain since this morning.

EXAM:
CHEST  2 VIEW

[Series 1: dg chest 2 view · 0.14mm/px · 2 of 2 slices shown]
[im 1/2]
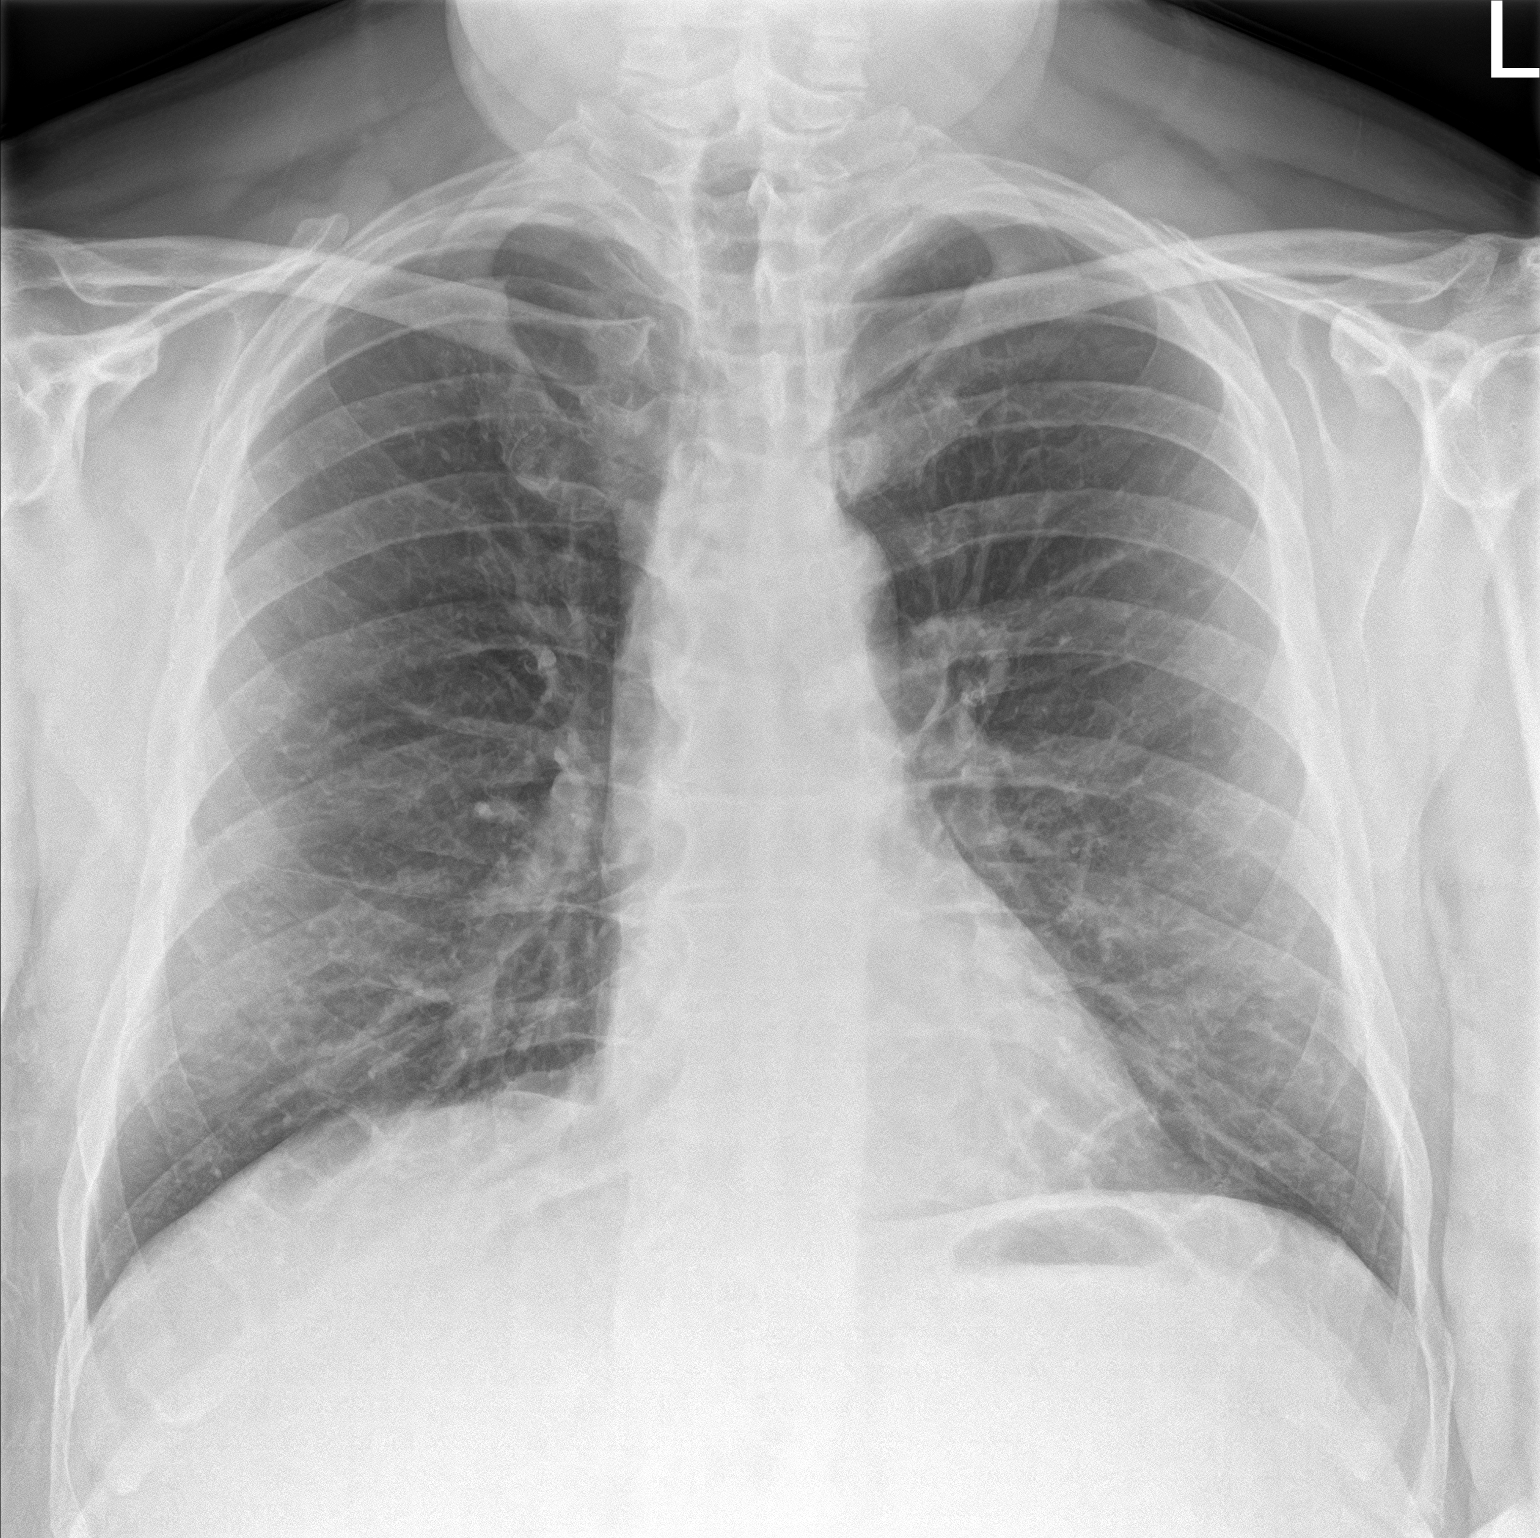
[im 2/2]
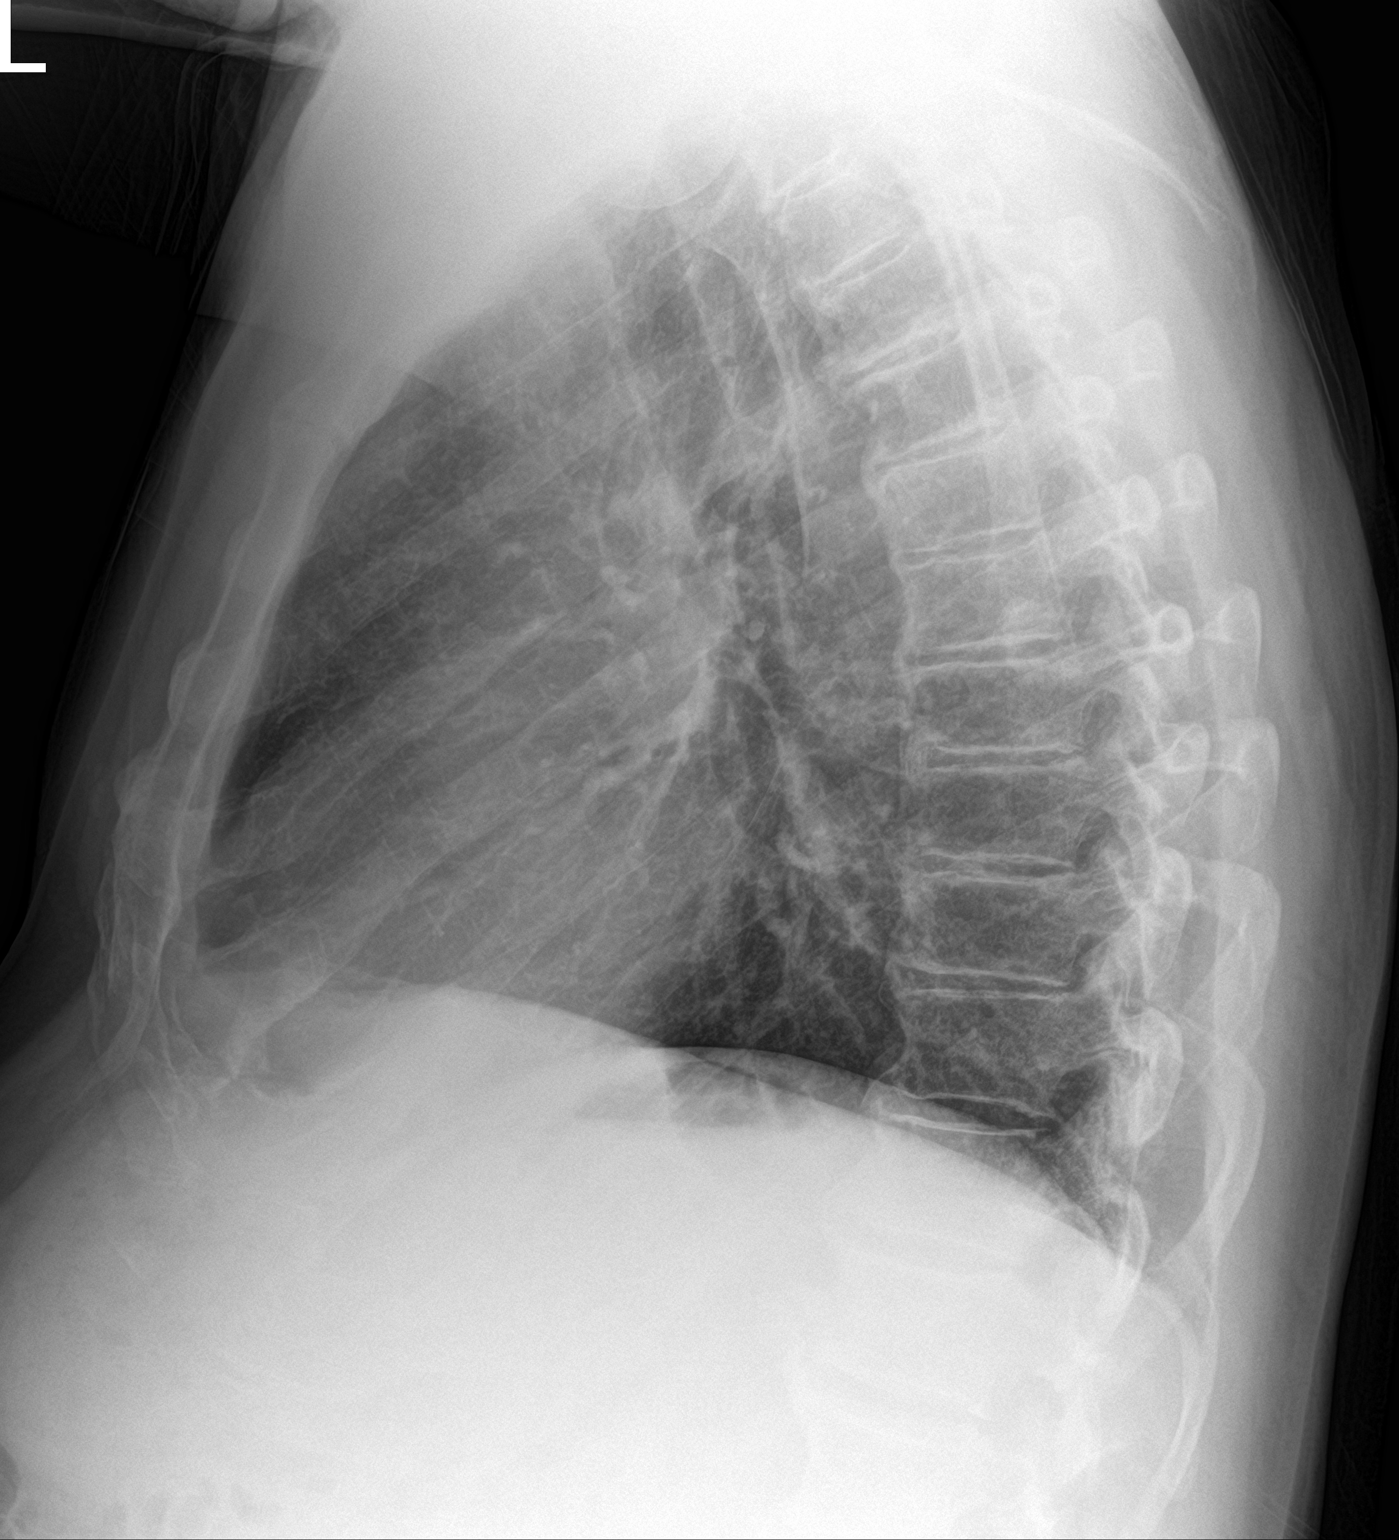

[2 of 2 positions shown; findings below may reference images not displayed]

FINDINGS: The lungs are hyperinflated likely secondary to COPD. There is no
focal parenchymal opacity. There is no pleural effusion or
pneumothorax. The heart and mediastinal contours are unremarkable.

The osseous structures are unremarkable.
IMPRESSION: No active cardiopulmonary disease.

## 2019-05-26 ENCOUNTER — Ambulatory Visit: Payer: BLUE CROSS/BLUE SHIELD

## 2019-07-26 ENCOUNTER — Other Ambulatory Visit: Payer: Self-pay | Admitting: Orthopedic Surgery

## 2019-07-26 DIAGNOSIS — Z96651 Presence of right artificial knee joint: Secondary | ICD-10-CM

## 2019-08-04 ENCOUNTER — Ambulatory Visit: Payer: BLUE CROSS/BLUE SHIELD

## 2020-02-26 IMAGING — US RIGHT LOWER EXTREMITY VENOUS ULTRASOUND
1 series · 13 of 24 positions shown · non-contrast
Comparison: None.

CLINICAL DATA: 58-year-old male with right lower extremity
lump/edema



[Series 1: right lower extremity venous ultrasound · 0.08mm/px · 13 of 52 slices shown]
[im 1/52]
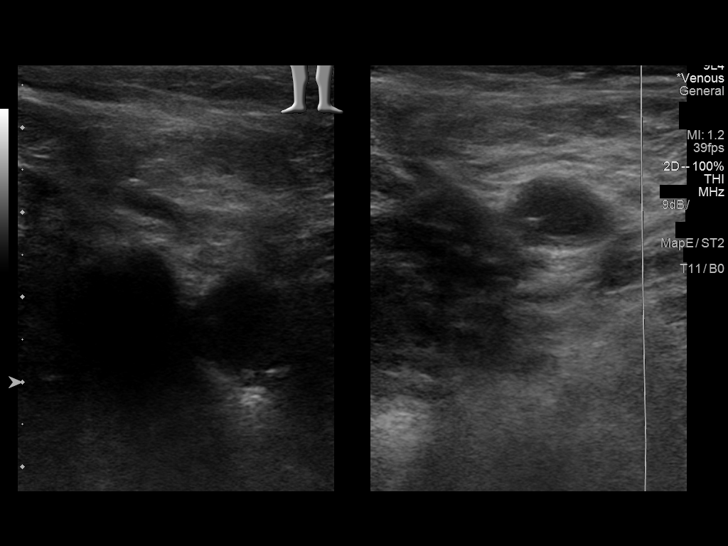
[im 5/52]
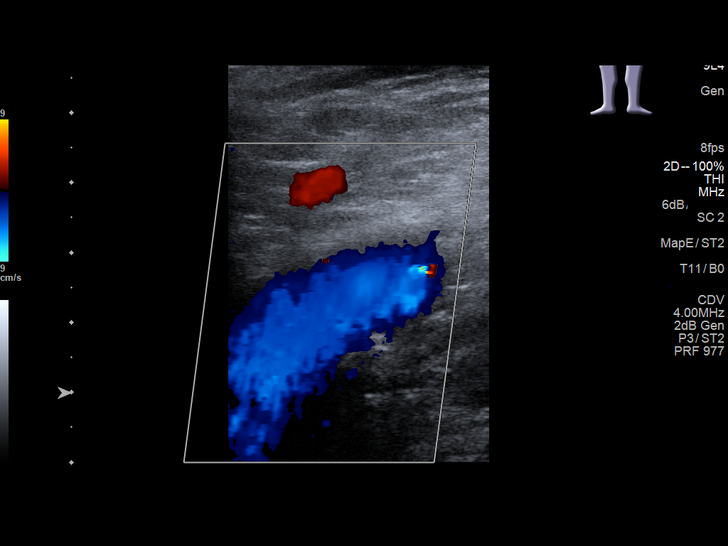
[im 9/52]
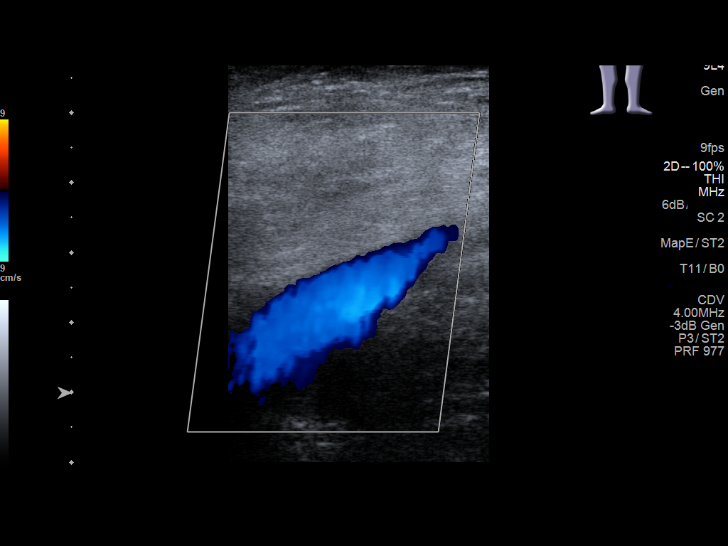
[im 14/52]
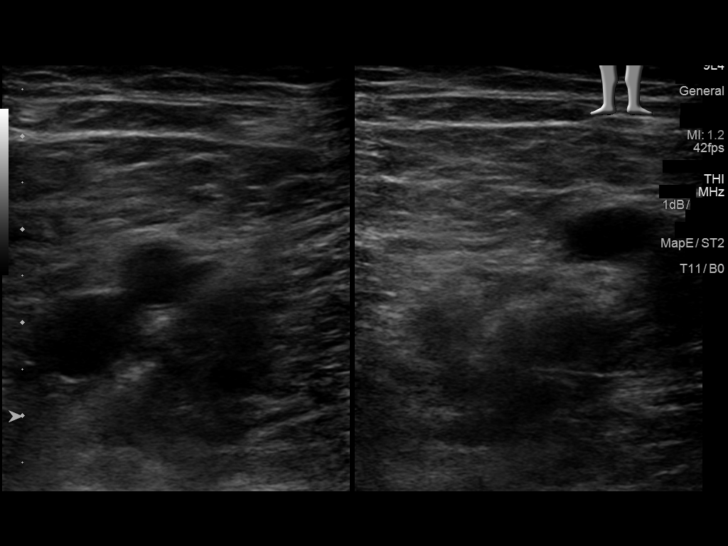
[im 18/52]
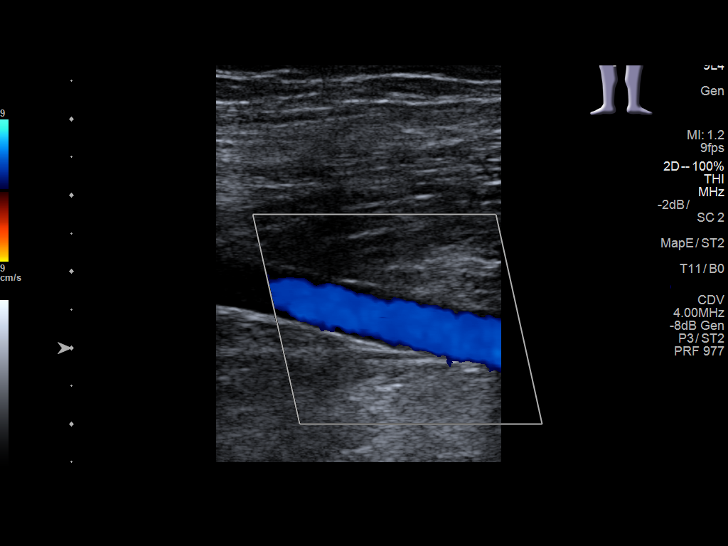
[im 23/52]
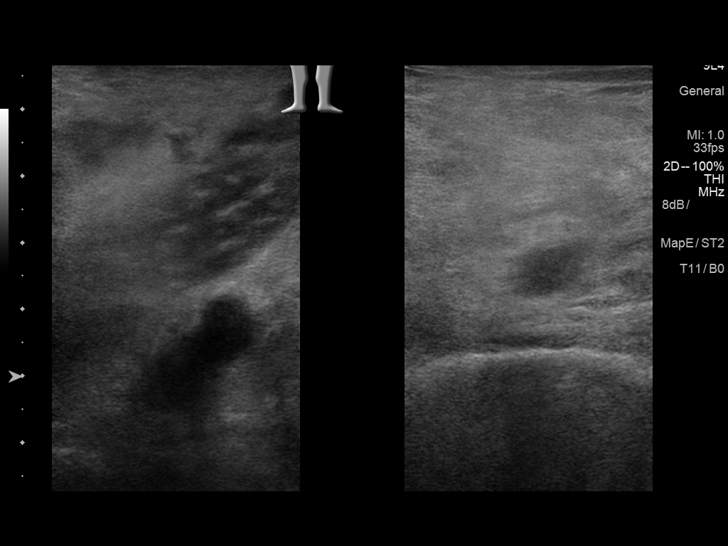
[im 27/52]
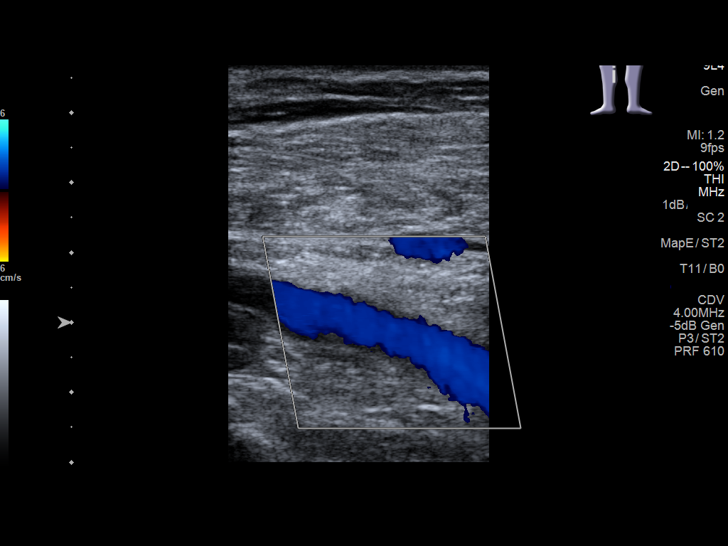
[im 29/52]
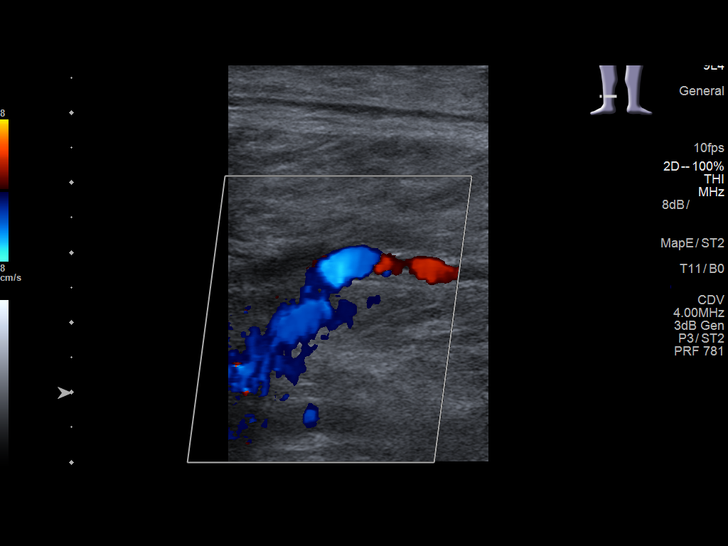
[im 34/52]
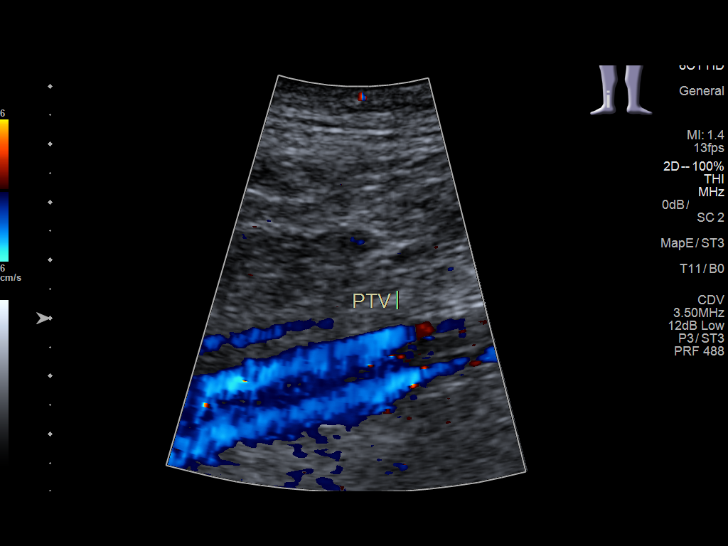
[im 38/52]
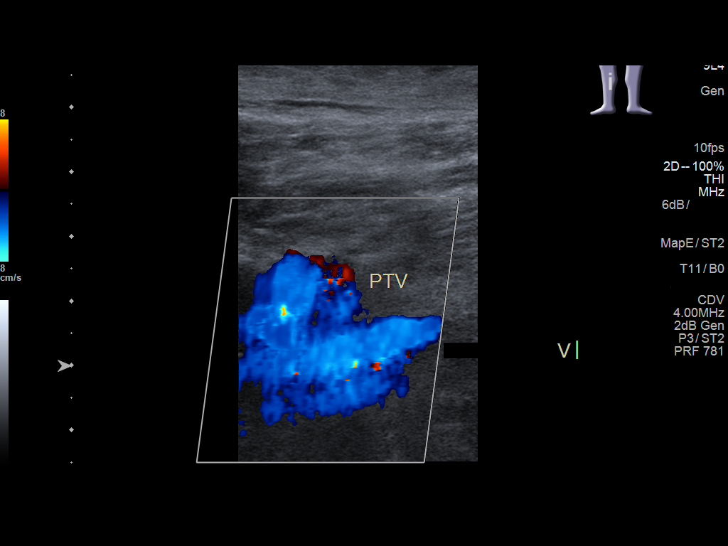
[im 43/52]
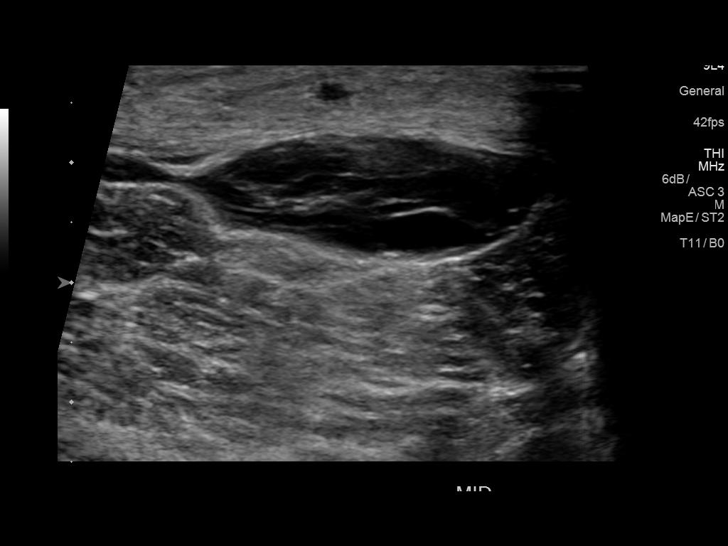
[im 47/52]
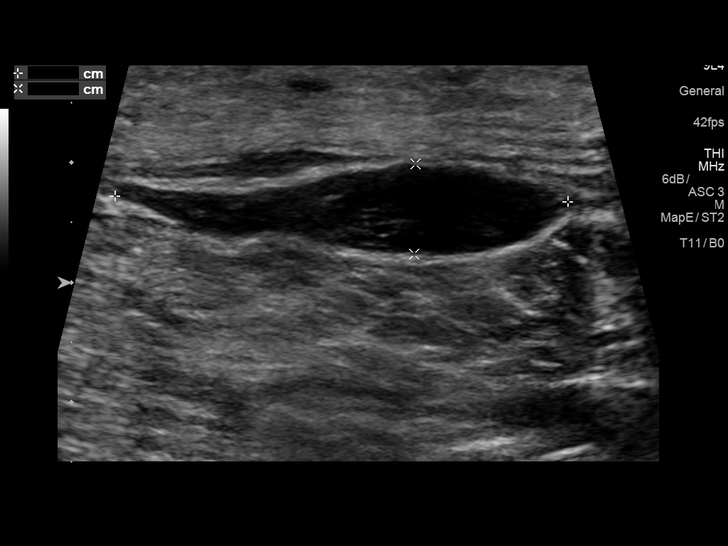
[im 52/52]
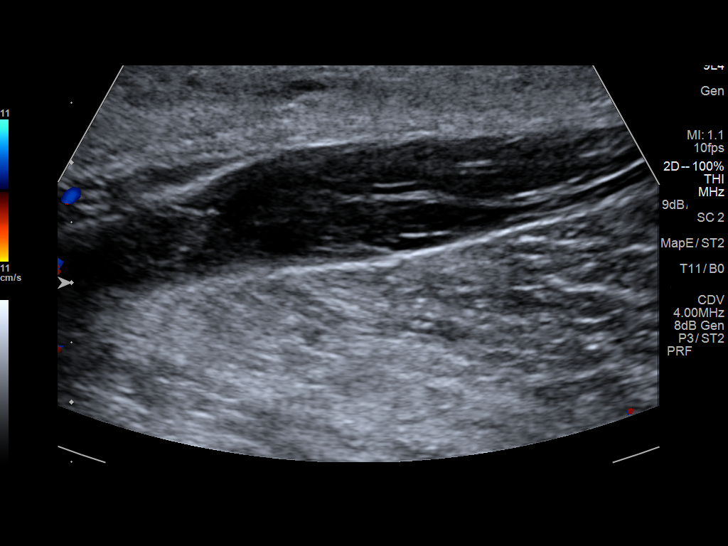

[13 of 24 positions shown; findings below may reference images not displayed]

FINDINGS: Contralateral Common Femoral Vein: Respiratory phasicity is normal
and symmetric with the symptomatic side. No evidence of thrombus.
Normal compressibility.

Common Femoral Vein: No evidence of thrombus. Normal
compressibility, respiratory phasicity and response to augmentation.

Saphenofemoral Junction: No evidence of thrombus. Normal
compressibility and flow on color Doppler imaging.

Profunda Femoral Vein: No evidence of thrombus. Normal
compressibility and flow on color Doppler imaging.

Femoral Vein: No evidence of thrombus. Normal compressibility,
respiratory phasicity and response to augmentation.

Popliteal Vein: No evidence of thrombus. Normal compressibility,
respiratory phasicity and response to augmentation.

Calf Veins: No evidence of thrombus. Normal compressibility and flow
on color Doppler imaging.

Superficial Great Saphenous Vein: No evidence of thrombus. Normal
compressibility.

Venous Reflux:  None.

Other Findings: Focal sonographic imaging in the popliteal fossa and
medial posterior upper calf region demonstrate a complex fluid
collection measuring 10.6 x 1.0 x 3.8 cm. The fluid collection
extends inferiorly along the superior aspect of the gastrocnemius
musculature. No evidence of internal vascularity on color Doppler
imaging.
IMPRESSION: 1. No evidence of deep venous thrombosis.
2. Elongated fluid collection tracking along the posteromedial
aspect of the medial head of the right gastrocnemius muscle. This
collection is favored to represent a hematoma and may reflect recent
prior gastrocnemius muscle injury. Other differential considerations
include fluid tracking inferiorly from a ruptured Baker's cyst, or
in the appropriate clinical setting, abscess.

## 2020-05-12 ENCOUNTER — Emergency Department: Payer: BLUE CROSS/BLUE SHIELD

## 2020-05-12 ENCOUNTER — Other Ambulatory Visit: Payer: Self-pay

## 2020-05-12 ENCOUNTER — Emergency Department
Admission: EM | Admit: 2020-05-12 | Discharge: 2020-05-12 | Disposition: A | Discharge status: Home / Self Care | Payer: BLUE CROSS/BLUE SHIELD | Attending: Emergency Medicine | Admitting: Emergency Medicine

## 2020-05-12 DIAGNOSIS — R202 Paresthesia of skin: Secondary | ICD-10-CM | POA: Insufficient documentation

## 2020-05-12 DIAGNOSIS — F17210 Nicotine dependence, cigarettes, uncomplicated: Secondary | ICD-10-CM | POA: Insufficient documentation

## 2020-05-12 DIAGNOSIS — R079 Chest pain, unspecified: Secondary | ICD-10-CM | POA: Diagnosis not present

## 2020-05-12 LAB — CBC
HCT: 47.2 % (ref 39.0–52.0)
Hemoglobin: 16.4 g/dL (ref 13.0–17.0)
MCH: 32 pg (ref 26.0–34.0)
MCHC: 34.7 g/dL (ref 30.0–36.0)
MCV: 92.2 fL (ref 80.0–100.0)
Platelets: 217 10*3/uL (ref 150–400)
RBC: 5.12 MIL/uL (ref 4.22–5.81)
RDW: 12.5 % (ref 11.5–15.5)
WBC: 6 10*3/uL (ref 4.0–10.5)
nRBC: 0 % (ref 0.0–0.2)

## 2020-05-12 LAB — TROPONIN I (HIGH SENSITIVITY)
Troponin I (High Sensitivity): 4 ng/L (ref <18)
Troponin I (High Sensitivity): 4 ng/L (ref <18)

## 2020-05-12 LAB — BASIC METABOLIC PANEL
Anion gap: 9 (ref 5–15)
BUN: 13 mg/dL (ref 8–23)
CO2: 22 mmol/L (ref 22–32)
Calcium: 9.1 mg/dL (ref 8.9–10.3)
Chloride: 101 mmol/L (ref 98–111)
Creatinine, Ser: 0.89 mg/dL (ref 0.61–1.24)
GFR, Estimated: >60 mL/min (ref >60)
Glucose, Bld: 113 mg/dL — ABNORMAL HIGH (ref 70–99)
Potassium: 3.8 mmol/L (ref 3.5–5.1)
Sodium: 132 mmol/L — ABNORMAL LOW (ref 135–145)

## 2020-05-12 MED ORDER — ASPIRIN EC 81 MG PO TBEC: 81.0000 mg | DELAYED_RELEASE_TABLET | Freq: Every day | ORAL | 2 refills | Status: AC

## 2020-05-12 MED ORDER — NITROGLYCERIN 0.4 MG/SPRAY TL SOLN: 1.0000 | Status: DC | PRN

## 2020-05-12 MED ORDER — NITROGLYCERIN 0.4 MG SL SUBL
0.4000 mg | SUBLINGUAL_TABLET | SUBLINGUAL | Status: DC | PRN
  Administered 2020-05-12: 0.4 mg via SUBLINGUAL
  Filled 2020-05-12: qty 1

## 2020-05-12 NOTE — ED Notes (Signed)
Pt states pain unchanged after administration of nitro, pt's BP 160/95 after administration of nitro.Marland Kitchen

## 2020-05-12 NOTE — ED Notes (Signed)
Repeat trop collected by this RN, pt tolerated well. Pt provided with warm blankets at this time, denies further needs.

## 2020-05-12 NOTE — ED Notes (Signed)
NAD noted at time of D/C. Pt denies questions or concerns. Pt ambulatory to the lobby at this time. Verbal consent for D/C obtained at this time.  

## 2020-05-12 NOTE — ED Triage Notes (Signed)
Pt states he has been under a lot of stress states his fiance has been in the hospital and just found out his daughter mother passed away on 30-Jun-2022. Pt states last night he had pain in his chest radiating into the shoulders with diaphoresis. States this morning "tingling" all over

## 2020-05-12 NOTE — Discharge Instructions (Signed)
Please seek medical attention for any high fevers, chest pain, shortness of breath, change in behavior, persistent vomiting, bloody stool or any other new or concerning symptoms.  
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
## 2021-11-26 IMAGING — CR DG CHEST 2V
1 series · 2 of 2 positions shown · non-contrast
Comparison: None

CLINICAL DATA: Acute chest pain

EXAM:
CHEST - 2 VIEW

[Series 1: dg chest 2 view · 0.14mm/px · 2 of 2 slices shown]
[im 1/2]
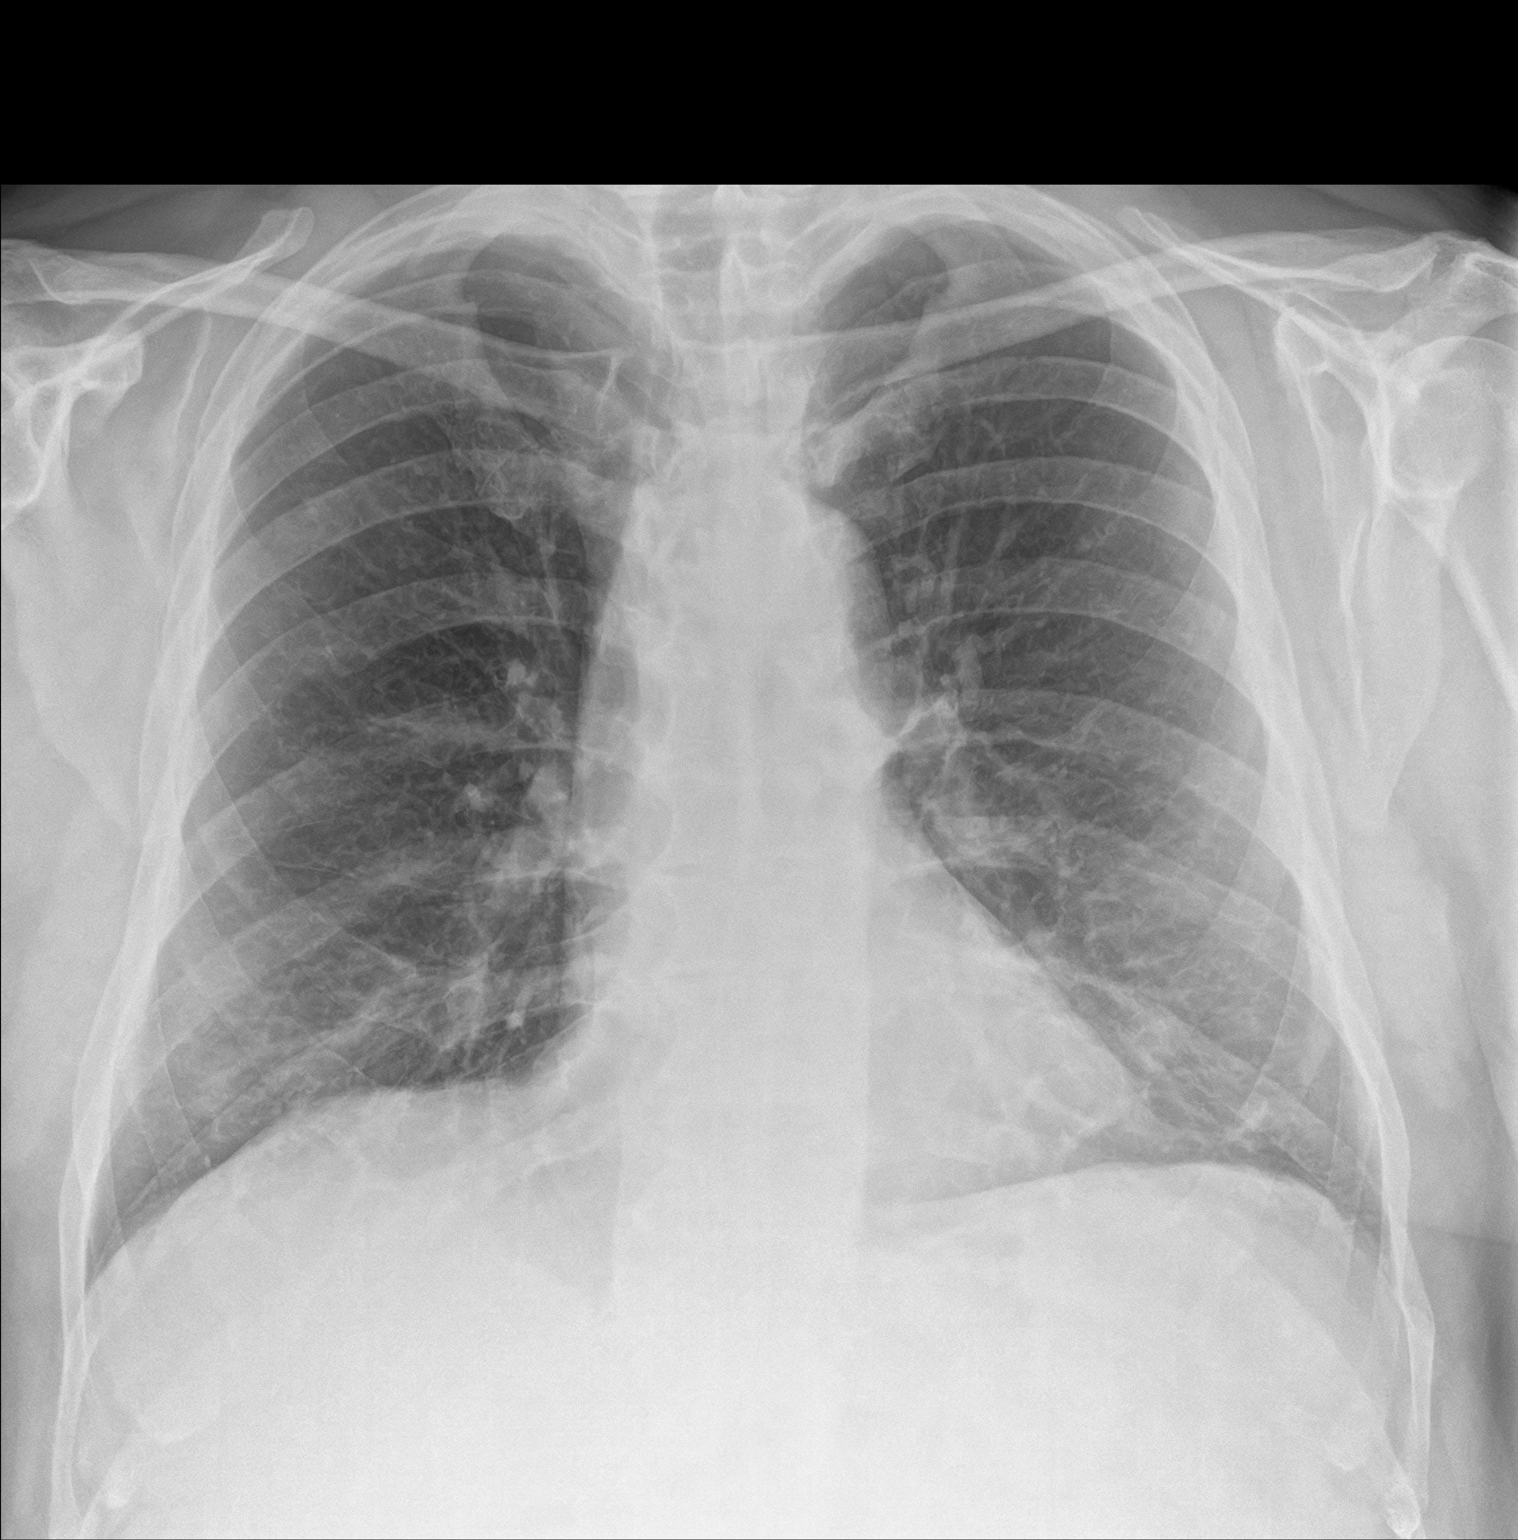
[im 2/2]
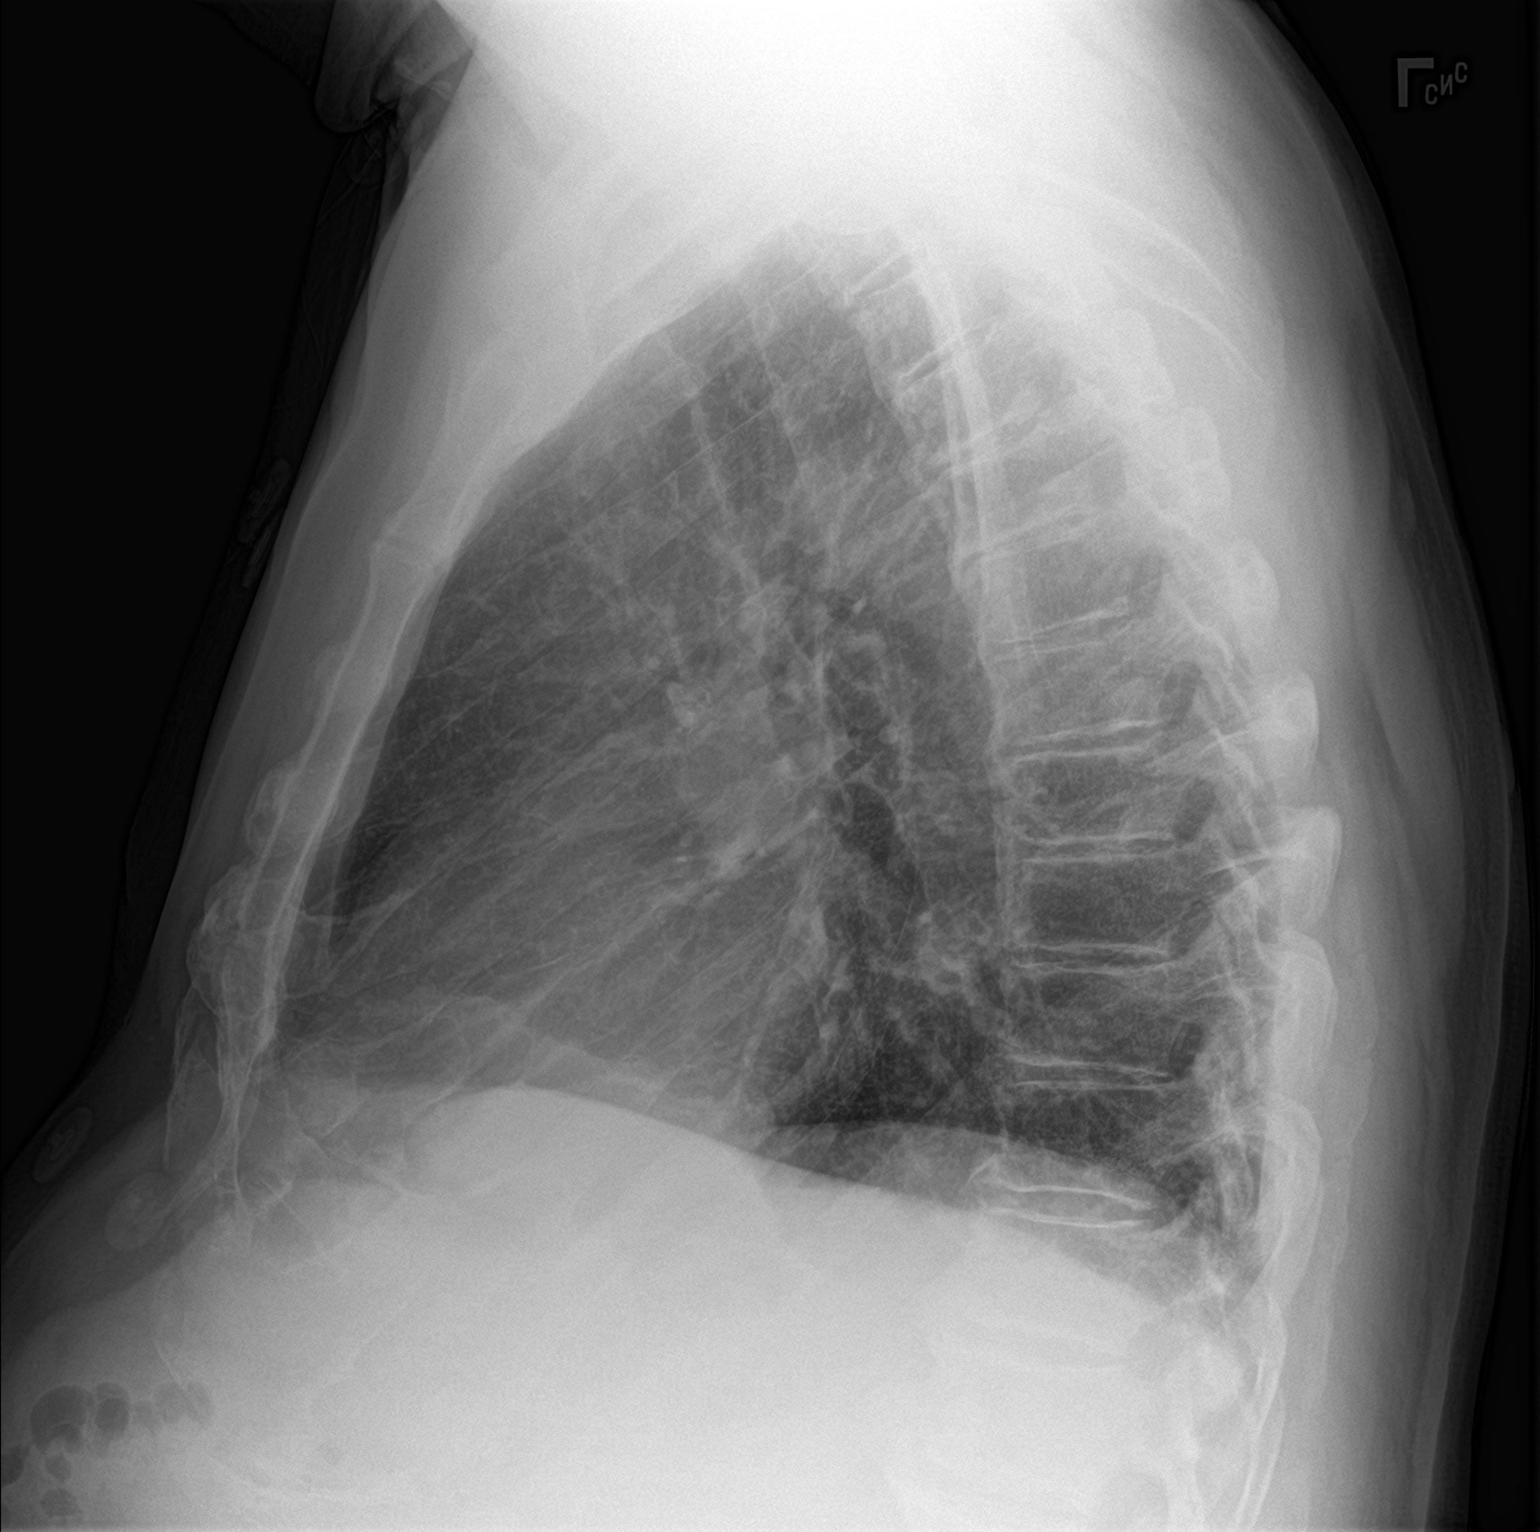

[2 of 2 positions shown; findings below may reference images not displayed]

FINDINGS: The cardiomediastinal silhouette is unremarkable.

Mild hyperinflation noted.

There is no evidence of focal airspace disease, pulmonary edema,
suspicious pulmonary nodule/mass, pleural effusion, or pneumothorax.

No acute bony abnormalities are identified.
IMPRESSION: Mild hyperinflation without evidence of acute cardiopulmonary
disease.
